# Patient Record
Sex: Male | Born: 1975 | Race: White | Hispanic: No | Marital: Married | Smoking: Never smoker
Health system: Southern US, Community
[De-identification: ages and names within clinical notes are randomized; demographics above are authoritative.]

---

## 2019-06-14 ENCOUNTER — Telehealth: Payer: Self-pay

## 2019-06-14 ENCOUNTER — Emergency Department (INDEPENDENT_AMBULATORY_CARE_PROVIDER_SITE_OTHER): Payer: BC Managed Care – PPO

## 2019-06-14 ENCOUNTER — Emergency Department
Admission: EM | Admit: 2019-06-14 | Discharge: 2019-06-14 | Disposition: A | Discharge status: Home / Self Care | Payer: BC Managed Care – PPO | Source: Home / Self Care

## 2019-06-14 ENCOUNTER — Other Ambulatory Visit: Payer: Self-pay

## 2019-06-14 DIAGNOSIS — M79642 Pain in left hand: Secondary | ICD-10-CM

## 2019-06-14 DIAGNOSIS — M7989 Other specified soft tissue disorders: Secondary | ICD-10-CM

## 2019-06-14 MED ORDER — PREDNISONE 50 MG PO TABS: 50.0000 mg | ORAL_TABLET | Freq: Every day | ORAL | 0 refills | Status: DC

## 2019-06-14 MED ORDER — PREDNISONE 50 MG PO TABS: 50.0000 mg | ORAL_TABLET | Freq: Every day | ORAL | 0 refills | Status: AC

## 2019-06-14 NOTE — ED Triage Notes (Signed)
Left hand pain x 1 week, denies an injury, yesterday pain was severe yesterday. Took tylenol yesterday

## 2019-06-14 NOTE — Discharge Instructions (Signed)
°  Please take the prednisone to help with inflammation and pain.  You may wear the splint for comfort.  Please call to schedule an appointment with Sports Medicine next week for recheck and further treatment of symptoms if not improving.

## 2019-06-14 NOTE — ED Provider Notes (Signed)
[facility]    CSN: [id number] Arrival date & time: 06/14/19  [id number]     History   Chief Complaint Chief Complaint  Patient presents with  . Hand Pain    HPI Marcus Weber is a 43 y.o. male.   HPI  Marcus Numbersathan Hilmes is a 43 y.o. male presenting to UC with c/o gradually worsening Left hand pain at the base of his little finger for about 1 week, worse with movement of his little finger. No known injury. He also noticed mild soreness in his Left forearm and a bump at the area of pain.  He does not recall any injury. He has tried Tylenol and ice but no relief.  He is Right hand dominant.    History reviewed. No pertinent past medical history.  There are no active problems to display for this patient.   History reviewed. No pertinent surgical history.     Home Medications    Prior to Admission medications   Medication Sig Start Date End Date Taking? Authorizing Provider  predniSONE (DELTASONE) 50 MG tablet Take 1 tablet (50 mg total) by mouth daily with breakfast for 5 days. 06/14/19 06/19/19  Lurene ShadowPhelps, Leontyne [place] O, PA-C    Family History History reviewed. No pertinent family history.  Social History Social History   Tobacco Use  . Smoking status: Never Smoker  . Smokeless tobacco: Never Used  Substance Use Topics  . Alcohol use: Not Currently  . Drug use: Not Currently     Allergies   Azithromycin   Review of Systems Review of Systems  Musculoskeletal: Positive for arthralgias, joint swelling and myalgias.  Skin: Positive for color change. Negative for wound.  Neurological: Positive for weakness (left little finger due to pain). Negative for headaches.     Physical Exam Triage Vital Signs ED Triage Vitals  Enc Vitals Group     BP 06/14/19 0959 137/84     Pulse Rate 06/14/19 0959 90     Resp 06/14/19 0959 20     Temp 06/14/19 0959 (!) 97.4 F (36.3 C)     Temp Source 06/14/19 0959 Tympanic     SpO2 06/14/19 0959 98 %     Weight 06/14/19 1001 273 lb  (123.8 kg)     Height 06/14/19 1001 5\' 8"  (1.727 m)     Head Circumference --      Peak Flow --      Pain Score 06/14/19 1000 2     Pain Loc --      Pain Edu? --      Excl. in GC? --    No data found.  Updated Vital Signs BP 137/84 (BP Location: Right Arm)   Pulse 90   Temp (!) 97.4 F (36.3 C) (Tympanic)   Resp 20   Ht 5\' 8"  (1.727 m)   Wt 273 lb (123.8 kg)   SpO2 98%   BMI 41.51 kg/m   Visual Acuity Right Eye Distance:   Left Eye Distance:   Bilateral Distance:    Right Eye Near:   Left Eye Near:    Bilateral Near:     Physical Exam Vitals signs and nursing note reviewed.  Constitutional:      Appearance: Normal appearance. He is well-developed.  HENT:     Head: Normocephalic and atraumatic.  Neck:     Musculoskeletal: Normal range of motion.  Cardiovascular:     Rate and Rhythm: Normal rate.  Pulmonary:     Effort: Pulmonary effort is  normal.  Musculoskeletal:        General: Swelling and tenderness present.       Hands:     Comments: Left forearm: mild tenderness over palpable mass/muscle spasm of mid forearm. Full ROM elbow and wrist. No bony tenderness. Left hand: mild edema and tenderness over palm aspect of 5th metacarpal joint. Slight decreased flexion of little finger. No tenderness to little finger.  Skin:    General: Skin is warm and dry.  Neurological:     Mental Status: He is alert and oriented to person, place, and time.  Psychiatric:        Behavior: Behavior normal.      UC Treatments / Results  Labs (all labs ordered are listed, but only abnormal results are displayed) Labs Reviewed - No data to display  EKG   Radiology Dg Hand Complete Left  Result Date: 06/14/2019 CLINICAL DATA:  Left hand pain for 2 weeks.  Hand swelling. EXAM: LEFT HAND - COMPLETE 3+ VIEW COMPARISON:  None. FINDINGS: The joint spaces are normal. No acute bony findings. Diffuse soft tissue swelling is noted. No radiopaque foreign body or gas in the soft  tissues. IMPRESSION: Diffuse soft tissue swelling but no significant bony findings. Electronically Signed   By: Marijo Sanes M.D.   On: 06/14/2019 10:45    Procedures Splint Application  Date/Time: 06/14/2019 12:57 PM Performed by: Noe Gens, PA-C Authorized by: Noe Gens, PA-C   Consent:    Consent obtained:  Verbal   Consent given by:  Patient   Risks discussed:  Discoloration, numbness, pain and swelling   Alternatives discussed:  No treatment and delayed treatment Pre-procedure details:    Sensation:  Normal   Skin color:  Warm, dry, pink Procedure details:    Laterality:  Left   Location:  Finger   Finger:  L small finger   Strapping: yes     Cast type:  Finger   Splint type:  Finger   Supplies:  Prefabricated splint Post-procedure details:    Pain:  Unchanged   Sensation:  Normal   Skin color:  Warm, dry, pink   Patient tolerance of procedure:  Tolerated well, no immediate complications   (including critical care time)  Medications Ordered in UC Medications - No data to display  Initial Impression / Assessment and Plan / UC Course  I have reviewed the triage vital signs and the nursing notes.  Pertinent labs & imaging results that were available during my care of the patient were reviewed by me and considered in my medical decision making (see chart for details).     Discussed imaging with pt Suspect tendonitis vs cyst. Will try conservative tx with prednisone and temporary finger/hand splint to limit use of Left little finger.   Encouraged f/u with Sports Medicine in 1-2 weeks if not improving.   Final Clinical Impressions(s) / UC Diagnoses   Final diagnoses:  Left hand pain     Discharge Instructions      Please take the prednisone to help with inflammation and pain.  You may wear the splint for comfort.  Please call to schedule an appointment with Sports Medicine next week for recheck and further treatment of symptoms if not improving.      ED Prescriptions    Medication Sig Dispense Auth. Provider   predniSONE (DELTASONE) 50 MG tablet Take 1 tablet (50 mg total) by mouth daily with breakfast for 5 days. 5 tablet Noe Gens, [place]  Controlled Substance Prescriptions [place] Controlled Substance Registry consulted? Not Applicable   Rolla Platehelps, Sherolyn Trettin O, PA-C 06/15/19 1006

## 2020-07-28 IMAGING — DX LEFT HAND - COMPLETE 3+ VIEW
3 series · 3 of 3 positions shown · non-contrast
Comparison: None.

CLINICAL DATA: Left hand pain for 2 weeks.  Hand swelling.

EXAM:
LEFT HAND - COMPLETE 3+ VIEW

[hand pa]
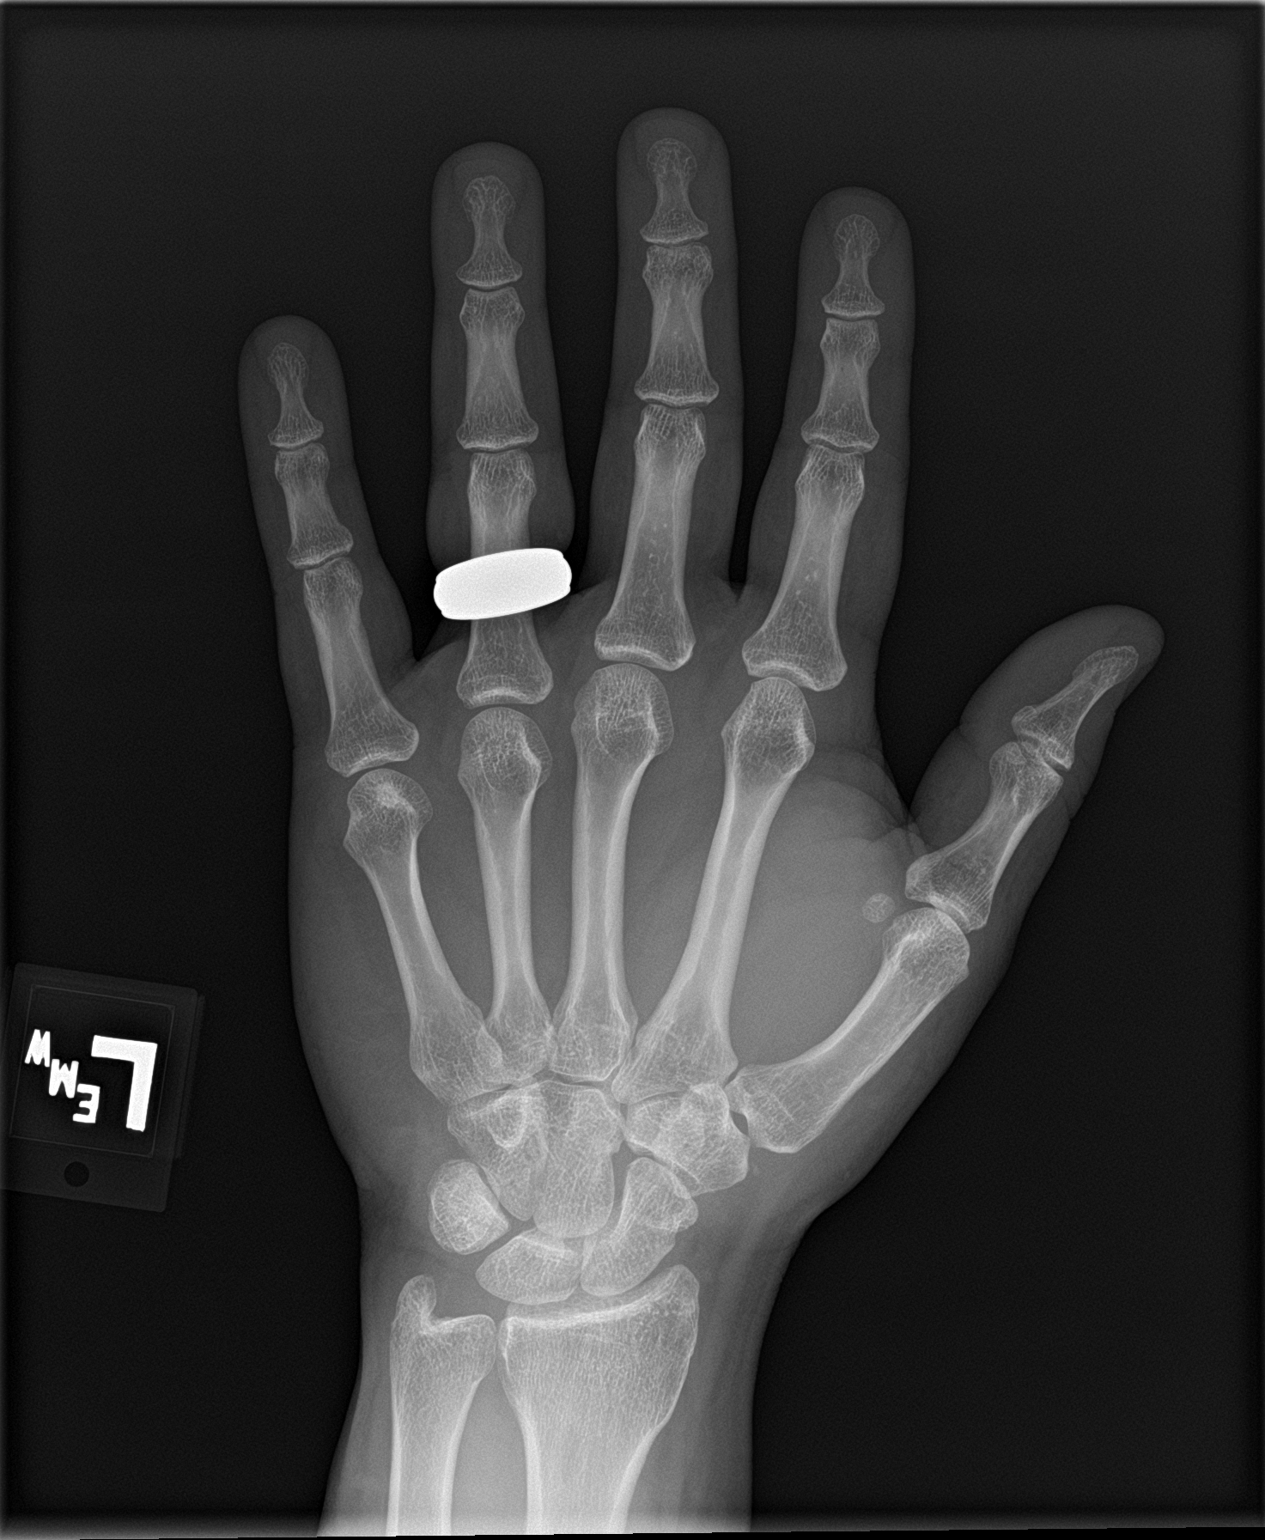

[hand obl]
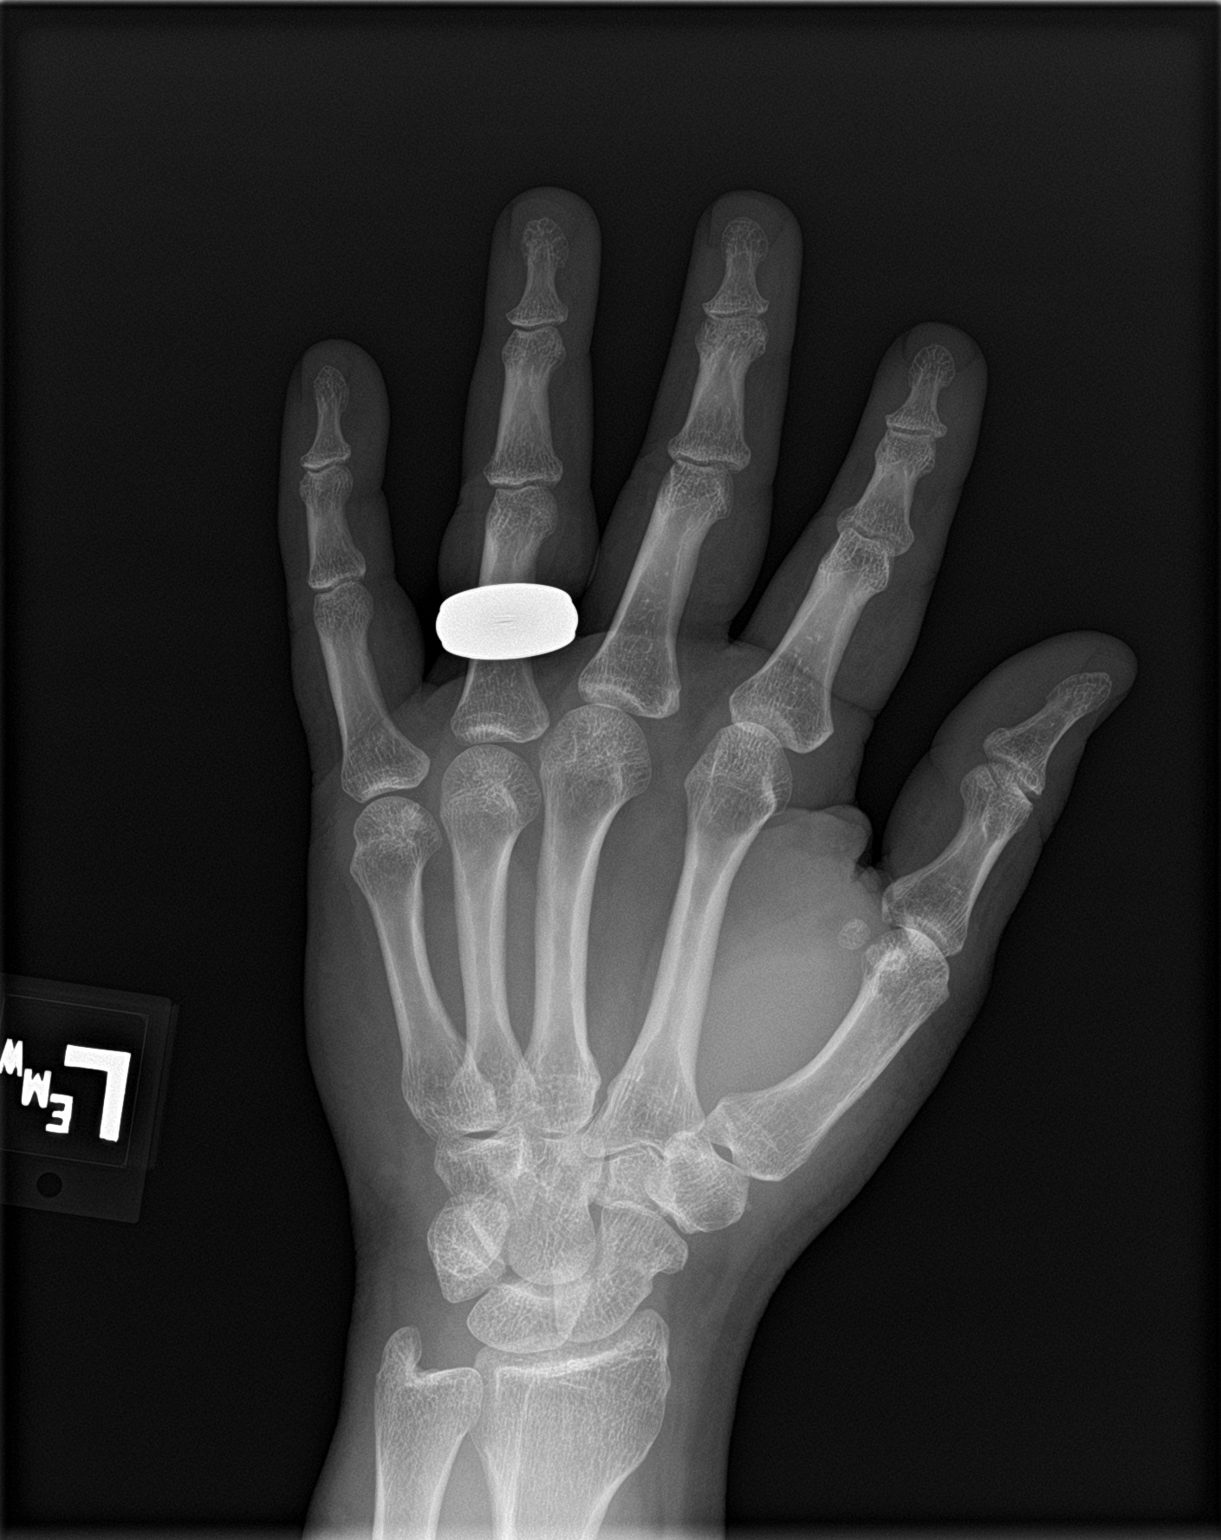

[hand lat]
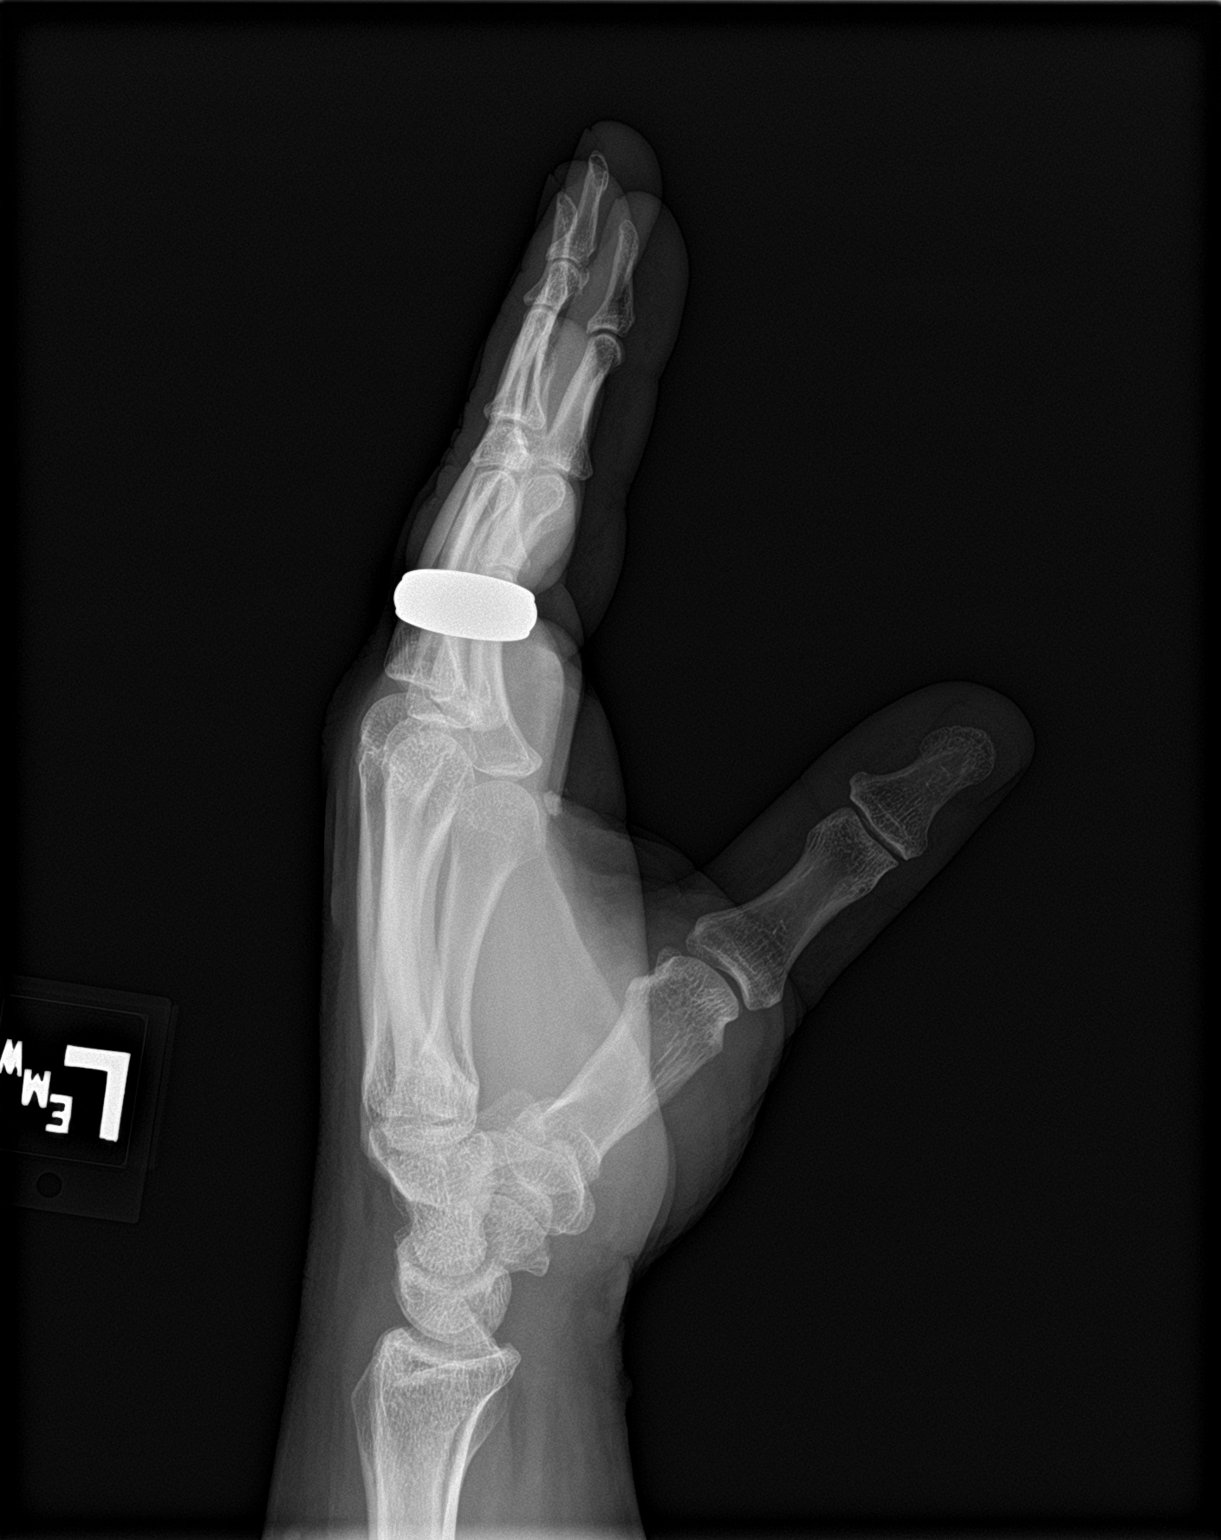

[3 of 3 positions shown; findings below may reference images not displayed]

FINDINGS: The joint spaces are normal. No acute bony findings. Diffuse soft
tissue swelling is noted. No radiopaque foreign body or gas in the
soft tissues.
IMPRESSION: Diffuse soft tissue swelling but no significant bony findings.
